# Patient Record
Sex: Female | Born: 1995 | Race: White | Hispanic: No | Marital: Single | Smoking: Never smoker
Health system: Southern US, Community
[De-identification: ages and names within clinical notes are randomized; demographics above are authoritative.]

## PROBLEM LIST (undated history)

## (undated) DIAGNOSIS — L709 Acne, unspecified: Secondary | ICD-10-CM

## (undated) HISTORY — DX: Acne, unspecified: L70.9

---
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
## 2017-07-10 DIAGNOSIS — M25562 Pain in left knee: Secondary | ICD-10-CM | POA: Diagnosis not present

## 2017-12-20 DIAGNOSIS — M940 Chondrocostal junction syndrome [Tietze]: Secondary | ICD-10-CM | POA: Diagnosis not present

## 2017-12-20 DIAGNOSIS — W0110XA Fall on same level from slipping, tripping and stumbling with subsequent striking against unspecified object, initial encounter: Secondary | ICD-10-CM | POA: Diagnosis not present

## 2018-11-06 DIAGNOSIS — F411 Generalized anxiety disorder: Secondary | ICD-10-CM | POA: Diagnosis not present

## 2019-02-18 DIAGNOSIS — M7652 Patellar tendinitis, left knee: Secondary | ICD-10-CM | POA: Diagnosis not present

## 2019-02-18 DIAGNOSIS — M25561 Pain in right knee: Secondary | ICD-10-CM | POA: Diagnosis not present

## 2019-02-18 DIAGNOSIS — M25562 Pain in left knee: Secondary | ICD-10-CM | POA: Diagnosis not present

## 2019-02-18 DIAGNOSIS — M7651 Patellar tendinitis, right knee: Secondary | ICD-10-CM | POA: Diagnosis not present

## 2020-02-10 DIAGNOSIS — Z20822 Contact with and (suspected) exposure to covid-19: Secondary | ICD-10-CM | POA: Diagnosis not present

## 2020-02-10 DIAGNOSIS — J029 Acute pharyngitis, unspecified: Secondary | ICD-10-CM | POA: Diagnosis not present

## 2020-07-02 ENCOUNTER — Ambulatory Visit (HOSPITAL_COMMUNITY)
Admission: EM | Admit: 2020-07-02 | Discharge: 2020-07-02 | Disposition: A | Discharge status: Home / Self Care | Payer: 59 | Attending: Student | Admitting: Student

## 2020-07-02 ENCOUNTER — Encounter (HOSPITAL_COMMUNITY): Payer: Self-pay

## 2020-07-02 ENCOUNTER — Other Ambulatory Visit: Payer: Self-pay

## 2020-07-02 DIAGNOSIS — J069 Acute upper respiratory infection, unspecified: Secondary | ICD-10-CM | POA: Diagnosis not present

## 2020-07-02 DIAGNOSIS — U071 COVID-19: Secondary | ICD-10-CM | POA: Insufficient documentation

## 2020-07-02 DIAGNOSIS — Z79899 Other long term (current) drug therapy: Secondary | ICD-10-CM | POA: Insufficient documentation

## 2020-07-02 DIAGNOSIS — Z283 Underimmunization status: Secondary | ICD-10-CM | POA: Diagnosis not present

## 2020-07-02 DIAGNOSIS — Z20822 Contact with and (suspected) exposure to covid-19: Secondary | ICD-10-CM | POA: Diagnosis present

## 2020-07-02 LAB — RESP PANEL BY RT-PCR (FLU A&B, COVID) ARPGX2
Influenza A by PCR: NEGATIVE
Influenza B by PCR: NEGATIVE
SARS Coronavirus 2 by RT PCR: POSITIVE — AB

## 2020-07-02 NOTE — ED Provider Notes (Signed)
[facility]    CSN: [id number] Arrival date & time: 07/02/20  1227      History   Chief Complaint Chief Complaint  Patient presents with  . Nasal Congestion  . Sore Throat    HPI Shirley Hines is a 24 y.o. female presenting with covid symptoms following covid exposure. Her mom was diagnosed with covid and pt has been in contact with her. Presenting with sore throat, congestion. Denies fevers/chills, n/v/d, shortness of breath, chest pain, cough,, facial pain, teeth pain, headaches,loss of taste/smell, swollen lymph nodes, ear pain. She is not vaccinated against covid-19.  HPI  Past Medical History:  Diagnosis Date  . Acne     Patient Active Problem List   Diagnosis Date Noted  . Pain in joint, lower leg 12/16/2013    History reviewed. No pertinent surgical history.  OB History   No obstetric history on file.      Home Medications    Prior to Admission medications   Medication Sig Start Date End Date Taking? Authorizing Provider  azithromycin (ZITHROMAX) 250 MG tablet 2 two tabs now, then 1 tablet daily for 4 days. 11/11/15   Peyton Najjar, MD  sertraline (ZOLOFT) 50 MG tablet Take 50 mg by mouth daily.    [provider]    Family History History reviewed. No pertinent family history.  Social History Social History   Tobacco Use  . Smoking status: Never Smoker  . Smokeless tobacco: Never Used  Substance Use Topics  . Alcohol use: Yes  . Drug use: Never     Allergies   Patient has no known allergies.   Review of Systems Review of Systems  HENT: Positive for congestion.   Respiratory: Positive for cough.   All other systems reviewed and are negative.    Physical Exam Triage Vital Signs ED Triage Vitals  Enc Vitals Group     BP 07/02/20 1323 101/64     Pulse Rate 07/02/20 1323 76     Resp 07/02/20 1323 18     Temp 07/02/20 1323 98.2 F (36.8 C)     Temp Source 07/02/20 1323 Oral     SpO2 07/02/20 1323 100 %      Weight --      Height --      Head Circumference --      Peak Flow --      Pain Score 07/02/20 1321 2     Pain Loc --      Pain Edu? --      Excl. in GC? --    No data found.  Updated Vital Signs BP 101/64 (BP Location: Right Arm)   Pulse 76   Temp 98.2 F (36.8 C) (Oral)   Resp 18   LMP 06/15/2020 (Approximate)   SpO2 100%   Visual Acuity Right Eye Distance:   Left Eye Distance:   Bilateral Distance:    Right Eye Near:   Left Eye Near:    Bilateral Near:     Physical Exam Vitals reviewed.  Constitutional:      General: She is not in acute distress.    Appearance: Normal appearance. She is not ill-appearing.  HENT:     Head: Normocephalic and atraumatic.     Right Ear: Hearing, tympanic membrane, ear canal and external ear normal. No swelling or tenderness. There is no impacted cerumen. No mastoid tenderness. Tympanic membrane is not perforated, erythematous, retracted or bulging.     Left Ear: Hearing,  tympanic membrane, ear canal and external ear normal. No swelling or tenderness. There is no impacted cerumen. No mastoid tenderness. Tympanic membrane is not perforated, erythematous, retracted or bulging.     Nose:     Right Sinus: No maxillary sinus tenderness or frontal sinus tenderness.     Left Sinus: No maxillary sinus tenderness or frontal sinus tenderness.     Mouth/Throat:     Mouth: Mucous membranes are moist.     Pharynx: Uvula midline. No oropharyngeal exudate or posterior oropharyngeal erythema.     Tonsils: No tonsillar exudate.  Cardiovascular:     Rate and Rhythm: Normal rate and regular rhythm.     Heart sounds: Normal heart sounds.  Pulmonary:     Breath sounds: Normal breath sounds and air entry. No decreased breath sounds, wheezing, rhonchi or rales.  Lymphadenopathy:     Cervical: No cervical adenopathy.  Neurological:     General: No focal deficit present.     Mental Status: She is alert and oriented to person, place, and time.   Psychiatric:        Attention and Perception: Attention and perception normal.        Mood and Affect: Mood and affect normal.        Behavior: Behavior is cooperative.      UC Treatments / Results  Labs (all labs ordered are listed, but only abnormal results are displayed) Labs Reviewed  RESP PANEL BY RT-PCR (FLU A&B, COVID) [id number]    EKG   Radiology No results found.  Procedures Procedures (including critical care time)  Medications Ordered in UC Medications - No data to display  Initial Impression / Assessment and Plan / UC Course  I have reviewed the triage vital signs and the nursing notes.  Pertinent labs & imaging results that were available during my care of the patient were reviewed by me and considered in my medical decision making (see chart for details).     Covid and influenza tests sent today. Patient is not vaccinated for covid-19. Isolation precautions per CDC guidelines until negative result. Symptomatic relief with OTC Mucinex, Nyquil, etc. Return precautions- new/worsening fevers/chills, shortness of breath, chest pain, abd pain, etc.   Final Clinical Impressions(s) / UC Diagnoses   Final diagnoses:  Acute upper respiratory infection     Discharge Instructions     We'll call you if the result of your covid or influenza test is positive. Use over the counter medications for symptomatic relief, like Mucinex, Nyquil, Tylenol/ibuprofen. Make sure to get plenty of rest and drink plenty of fluids.    ED Prescriptions    None     PDMP not reviewed this encounter.   Rhys Martini, PA-C 07/02/20 1353

## 2020-07-02 NOTE — Discharge Instructions (Addendum)
We'll call you if the result of your covid or influenza test is positive. Use over the counter medications for symptomatic relief, like Mucinex, Nyquil, Tylenol/ibuprofen. Make sure to get plenty of rest and drink plenty of fluids.

## 2020-07-02 NOTE — ED Triage Notes (Signed)
Pt in with c/o ST, runny nose and congestion that started today. States she was exposed to someone who tested positive for covid today  Denies nausea, vomiting, diarrhea

## 2020-12-03 LAB — CYTOLOGY - PAP: Pap: NEGATIVE

## 2021-12-17 NOTE — Progress Notes (Signed)
 This patient's chart has been reviewed by a [facility] Specialist.     Attempted to contact patient in order to discuss appointments and screenings due for the upcoming year.     UTR with recommendations and contact information.

## 2023-08-24 LAB — OB RESULTS CONSOLE PLATELET COUNT: Platelets: 239

## 2023-08-24 LAB — OB RESULTS CONSOLE HGB/HCT, BLOOD
HCT: 36 (ref 29–41)
Hemoglobin: 12.5

## 2023-08-24 LAB — CYTOLOGY - PAP

## 2023-12-18 LAB — OB RESULTS CONSOLE HGB/HCT, BLOOD
HCT: 36 (ref 29–41)
Hemoglobin: 12.5

## 2023-12-18 LAB — OB RESULTS CONSOLE PLATELET COUNT: Platelets: 239

## 2023-12-18 LAB — OB RESULTS CONSOLE ABO/RH: "RH Type ": POSITIVE

## 2023-12-18 LAB — OB RESULTS CONSOLE ANTIBODY SCREEN: Antibody Screen: NEGATIVE

## 2024-01-29 LAB — OB RESULTS CONSOLE HEPATITIS B SURFACE ANTIGEN: Hepatitis B Surface Ag: NEGATIVE

## 2024-01-29 LAB — HEPATITIS C ANTIBODY: HCV Ab: NEGATIVE

## 2024-01-29 LAB — OB RESULTS CONSOLE RPR: RPR: NONREACTIVE

## 2024-01-29 LAB — OB RESULTS CONSOLE GC/CHLAMYDIA
Chlamydia: NEGATIVE
Neisseria Gonorrhea: NEGATIVE

## 2024-01-29 LAB — OB RESULTS CONSOLE ANTIBODY SCREEN: Antibody Screen: NEGATIVE

## 2024-01-29 LAB — OB RESULTS CONSOLE RUBELLA ANTIBODY, IGM: Rubella: IMMUNE

## 2024-01-29 LAB — OB RESULTS CONSOLE HIV ANTIBODY (ROUTINE TESTING): HIV: NONREACTIVE

## 2024-01-29 LAB — OB RESULTS CONSOLE VARICELLA ZOSTER ANTIBODY, IGG: Varicella: IMMUNE

## 2024-01-30 LAB — OB RESULTS CONSOLE ABO/RH: "RH Type ": POSITIVE

## 2024-06-04 LAB — OB RESULTS CONSOLE HGB/HCT, BLOOD
HCT: 33 (ref 29–41)
Hemoglobin: 10.9

## 2024-06-04 LAB — OB RESULTS CONSOLE PLATELET COUNT: Platelets: 259

## 2024-06-04 LAB — OB RESULTS CONSOLE RPR: RPR: NONREACTIVE

## 2024-06-26 ENCOUNTER — Ambulatory Visit: Payer: Self-pay | Admitting: Certified Nurse Midwife

## 2024-06-26 ENCOUNTER — Encounter: Payer: Self-pay | Admitting: Certified Nurse Midwife

## 2024-06-26 ENCOUNTER — Other Ambulatory Visit: Payer: Self-pay

## 2024-06-26 VITALS — BP 94/62 | HR 67 | Wt 151.4 lb

## 2024-06-26 DIAGNOSIS — O0933 Supervision of pregnancy with insufficient antenatal care, third trimester: Secondary | ICD-10-CM

## 2024-06-26 DIAGNOSIS — Z3490 Encounter for supervision of normal pregnancy, unspecified, unspecified trimester: Secondary | ICD-10-CM | POA: Insufficient documentation

## 2024-06-26 DIAGNOSIS — O99343 Other mental disorders complicating pregnancy, third trimester: Secondary | ICD-10-CM | POA: Diagnosis not present

## 2024-06-26 DIAGNOSIS — Z3A31 31 weeks gestation of pregnancy: Secondary | ICD-10-CM

## 2024-06-26 DIAGNOSIS — F419 Anxiety disorder, unspecified: Secondary | ICD-10-CM

## 2024-06-26 NOTE — Progress Notes (Unsigned)
 History:   Shirley Hines is a 28 y.o. G1P0 at [redacted]w[redacted]d by {Ob dating:14516} being seen today for her first obstetrical visit.  Her obstetrical history is significant for {ob risk factors:10154}. Patient {does/does not:19097} intend to breast feed. Pregnancy history fully reviewed.  Patient reports {sx:14538}.      HISTORY: OB History  Gravida Para Term Preterm AB Living  1 0 0 0 0 0  SAB IAB Ectopic Multiple Live Births  0 0 0 0 0    # Outcome Date GA Lbr Len/2nd Weight Sex Type Anes PTL Lv  1 Current             Last pap smear was done *** and was {Normal/Abnormal Appearance:21344::normal}  Past Medical History:  Diagnosis Date   Acne    No past surgical history on file. No family history on file. Social History[1] Allergies[2] Medications Ordered Prior to Encounter[3]  Review of Systems Pertinent items noted in HPI and remainder of comprehensive ROS otherwise negative. Physical Exam:   Vitals:   06/26/24 1347  BP: 94/62  Pulse: 67  Weight: 151 lb 6.4 oz (68.7 kg)   Fetal Heart Rate (bpm): 138  Constitutional: Well-developed, well-nourished pregnant female in no acute distress.  HEENT: PERRLA Skin: normal color and turgor, no rash Cardiovascular: normal rate & rhythm, warm and well perfused Respiratory: normal effort, no problems with respiration noted GI: Abd soft, non-distended MS: Extremities nontender, no edema, normal ROM Neurologic: Alert and oriented x 4.  GU: no CVA tenderness Pelvic: NEFG, physiologic discharge, no blood, cervix clean. ***Pap/swabs collected ***Exam deferred  Assessment:    Pregnancy: G1P0 Patient Active Problem List   Diagnosis Date Noted   Supervision of low-risk pregnancy 06/26/2024   Anxiety 02/28/2024   Pain in joint, lower leg 12/16/2013     Plan:    1. Encounter for supervision of low-risk pregnancy, antepartum (Primary) ***  2. [redacted] weeks gestation of pregnancy ***  3. Anxiety ***  4. Initial obstetric visit  in third trimester ***   - Initial labs drawn. - Continue prenatal vitamins. - Problem list reviewed and updated. - Genetic Screening discussed, {Blank multiple:19196::First trimester screen,Quad screen,NIPS}: {requests/ordered/declines:14581}. - Ultrasound discussed; fetal anatomic survey: {requests/ordered/declines:14581}. - Anticipatory guidance about prenatal visits given including labs, ultrasounds, and testing. - Discussed usage of Babyscripts and virtual visits as additional source of managing and completing prenatal visits in midst of coronavirus and pandemic.   - Encouraged to complete MyChart Registration for her ability to review results, send requests, and have questions addressed.  - The nature of [facility] [facility]Faculty Practice with multiple MDs and Advanced Practice Providers was explained to patient; also emphasized that residents, students are part of our team. - Routine obstetric precautions reviewed. Encouraged to seek out care at office or emergency room [facility] preferred) for urgent and/or emergent concerns.  Return in about 2 weeks (around 07/10/2024) for IN-PERSON, LOB.    No future appointments.  Cornell Finder, MSN, CNM, IBCLC Certified Nurse Midwife, [facility]     [1]  Social History Tobacco Use   Smoking status: Never   Smokeless tobacco: Never  Substance Use Topics   Alcohol use: Yes   Drug use: Never  [2] No Known Allergies [3]  Current Outpatient Medications on File Prior to Visit  Medication Sig Dispense Refill   azithromycin  (ZITHROMAX ) 250 MG tablet 2 two tabs now, then 1 tablet daily for 4 days. 6 tablet 0  Prenatal Vit-Fe Fumarate-FA (PRENATAL PO)      sertraline (ZOLOFT) 50 MG tablet Take 50 mg by mouth daily.     No current facility-administered medications on file prior to visit.

## 2024-06-26 NOTE — Patient Instructions (Signed)
   Considering Waterbirth? Guide for patients at [facility]) Why consider waterbirth? Gentle birth for babies  Less pain medicine used in labor  May allow for passive descent/less pushing  May reduce perineal tears  More mobility and instinctive maternal position changes  Increased maternal relaxation   Is waterbirth safe? What are the risks of infection, drowning or other complications? Infection:  Very low risk (3.7 % for tub vs 4.8% for bed)  7 in 8000 waterbirths with documented infection  Poorly cleaned equipment most common cause  Slightly lower group B strep transmission rate  Drowning  Maternal:  Very low risk  Related to seizures or fainting  Newborn:  Very low risk. No evidence of increased risk of respiratory problems in multiple large studies  Physiological protection from breathing under water  Avoid underwater birth if there are any fetal complications  Once baby's head is out of the water, keep it out.  Birth complication  Some reports of cord trauma, but risk decreased by bringing baby to surface gradually  No evidence of increased risk of shoulder dystocia. Mothers can usually change positions faster in water than in a bed, possibly aiding the maneuvers to free the shoulder.   There are 2 things you MUST do to have a waterbirth with [facility]: Attend a waterbirth class at [facility]   3rd Wednesday of every month from 7-9 pm (virtual during COVID) [employer] at [email or web address] or [email or web address] or by calling [phone] Bring us  the certificate from the class to your prenatal appointment or send via MyChart Meet with a midwife at 36 weeks* to see if you can still plan a waterbirth and to sign the consent.   *We also recommend that you schedule as many of your prenatal visits with a midwife as possible.    Helpful information: You may want to bring a bathing suit top to the hospital  to wear during labor but this is optional.  All other supplies are provided by the hospital. Please arrive at the hospital with signs of active labor, and do not wait at home until late in labor. It takes 45 min- 1 hour for fetal monitoring, and check in to your room to take place, plus transport and filling of the waterbirth tub.    Things that would prevent you from having a waterbirth: Premature, <37wks  Previous cesarean birth  Presence of thick meconium-stained fluid  Multiple gestation (Twins, triplets, etc.)  Uncontrolled diabetes or gestational diabetes requiring medication  Hypertension diagnosed in pregnancy or preexisting hypertension (gestational hypertension, preeclampsia, or chronic hypertension) Fetal growth restriction (your baby measures less than 10th percentile on ultrasound) Heavy vaginal bleeding  Non-reassuring fetal heart rate  Active infection (MRSA, etc.). Group B Strep is NOT a contraindication for waterbirth.  If your labor has to be induced and induction method requires continuous monitoring of the baby's heart rate  Other risks/issues identified by your obstetrical provider   Please remember that birth is unpredictable. Under certain unforeseeable circumstances your provider may advise against giving birth in the tub. These decisions will be made on a case-by-case basis and with the safety of you and your baby as our highest priority.    Updated 10/13/21

## 2024-07-17 ENCOUNTER — Other Ambulatory Visit: Payer: Self-pay

## 2024-07-17 ENCOUNTER — Ambulatory Visit (INDEPENDENT_AMBULATORY_CARE_PROVIDER_SITE_OTHER): Payer: Self-pay | Admitting: Certified Nurse Midwife

## 2024-07-17 VITALS — BP 97/51 | HR 73 | Wt 149.0 lb

## 2024-07-17 DIAGNOSIS — Z3493 Encounter for supervision of normal pregnancy, unspecified, third trimester: Secondary | ICD-10-CM

## 2024-07-17 DIAGNOSIS — Z3A34 34 weeks gestation of pregnancy: Secondary | ICD-10-CM | POA: Diagnosis not present

## 2024-07-17 DIAGNOSIS — K219 Gastro-esophageal reflux disease without esophagitis: Secondary | ICD-10-CM | POA: Diagnosis not present

## 2024-07-17 LAB — GLUCOSE TOLERANCE, 1 HOUR: Glucose, 1 Hr, gestational: 74

## 2024-07-17 LAB — HEMOGLOBIN A1C: Hemoglobin-A1c: 5

## 2024-07-17 MED ORDER — OMEPRAZOLE 40 MG PO CPDR: 40.0000 mg | DELAYED_RELEASE_CAPSULE | Freq: Every day | ORAL | 2 refills | Status: DC

## 2024-07-17 NOTE — Progress Notes (Signed)
" ° °  PRENATAL VISIT NOTE  Subjective:  Shirley Hines is a 29 y.o. G1P0 at [redacted]w[redacted]d being seen today for ongoing prenatal care.  She is currently monitored for the following issues for this low-risk pregnancy and has Anxiety and Supervision of low-risk pregnancy on their problem list.  Patient reports increase in nausea, always in the am and increased pelvic pressure.  Contractions: Not present. Vag. Bleeding: None.  Movement: Present. Denies leaking of fluid.   The following portions of the patient's history were reviewed and updated as appropriate: allergies, current medications, past family history, past medical history, past social history, past surgical history and problem list.   Objective:   Vitals:   07/17/24 0924  BP: (!) 97/51  Pulse: 73  Weight: 149 lb (67.6 kg)   Fetal Status:  Fetal Heart Rate (bpm): 151 Fundal Height: 34 cm Movement: Present Presentation: Vertex  General: Alert, oriented and cooperative. Patient is in no acute distress.  Skin: Skin is warm and dry. No rash noted.   Cardiovascular: Normal heart rate noted  Respiratory: Normal respiratory effort, no problems with respiration noted  Abdomen: Soft, gravid, appropriate for gestational age.  Pain/Pressure: Absent     Pelvic: Cervical exam deferred        Extremities: Normal range of motion.  Edema: None  Mental Status: Normal mood and affect. Normal behavior. Normal judgment and thought content.      06/26/2024    2:23 PM 11/06/2015    8:21 AM  Depression screen PHQ 2/9  Decreased Interest 0 0  Down, Depressed, Hopeless 0 0  PHQ - 2 Score 0 0  Altered sleeping 0   Tired, decreased energy 1   Change in appetite 0   Feeling bad or failure about yourself  0   Trouble concentrating 0   Moving slowly or fidgety/restless 0   Suicidal thoughts 0   PHQ-9 Score 1         06/26/2024    2:23 PM  GAD 7 : Generalized Anxiety Score  Nervous, Anxious, on Edge 1  Control/stop worrying 1  Worry too much -  different things 1  Trouble relaxing 0  Restless 0  Easily annoyed or irritable 1  Afraid - awful might happen 0  Total GAD 7 Score 4    Assessment and Plan:  Pregnancy: G1P0 at [redacted]w[redacted]d 1. Encounter for supervision of low-risk pregnancy in third trimester (Primary) - Doing well, feeling regular and vigorous fetal movement   2. [redacted] weeks gestation of pregnancy - Routine OB care including anticipatory guidance re GBS at next visit  3. Gastroesophageal reflux disease without esophagitis - omeprazole  (PRILOSEC) 40 MG capsule; Take 1 capsule (40 mg total) by mouth daily.  Dispense: 60 capsule; Refill: 2   Preterm labor symptoms and general obstetric precautions including but not limited to vaginal bleeding, contractions, leaking of fluid and fetal movement were reviewed in detail with the patient. Please refer to After Visit Summary for other counseling recommendations.   Return in about 2 weeks (around 07/31/2024) for IN-PERSON, LOB/GBS.  Future Appointments  Date Time Provider Department Center  07/24/2024 10:55 AM Vannie Cornell JONELLE EDDY [facility]  07/31/2024  8:15 AM Vannie Cornell JONELLE, CNM [facility]  08/07/2024 10:55 AM Vannie Cornell JONELLE, CNM [facility]   Cornell JONELLE Vannie, CNM "

## 2024-07-18 ENCOUNTER — Encounter: Payer: Self-pay | Admitting: Certified Nurse Midwife

## 2024-07-18 DIAGNOSIS — K219 Gastro-esophageal reflux disease without esophagitis: Secondary | ICD-10-CM

## 2024-07-18 MED ORDER — OMEPRAZOLE 40 MG PO CPDR: 40.0000 mg | DELAYED_RELEASE_CAPSULE | Freq: Every day | ORAL | 2 refills | Status: DC

## 2024-07-24 ENCOUNTER — Encounter: Payer: Self-pay | Admitting: Certified Nurse Midwife

## 2024-07-31 ENCOUNTER — Other Ambulatory Visit: Payer: Self-pay

## 2024-07-31 ENCOUNTER — Other Ambulatory Visit (HOSPITAL_COMMUNITY)
Admission: RE | Admit: 2024-07-31 | Discharge: 2024-07-31 | Disposition: A | Discharge status: Home / Self Care | Source: Ambulatory Visit | Attending: Certified Nurse Midwife | Admitting: Certified Nurse Midwife

## 2024-07-31 ENCOUNTER — Ambulatory Visit (INDEPENDENT_AMBULATORY_CARE_PROVIDER_SITE_OTHER): Payer: Self-pay | Admitting: Certified Nurse Midwife

## 2024-07-31 VITALS — BP 101/66 | HR 86 | Wt 153.6 lb

## 2024-07-31 DIAGNOSIS — O219 Vomiting of pregnancy, unspecified: Secondary | ICD-10-CM

## 2024-07-31 DIAGNOSIS — Z3493 Encounter for supervision of normal pregnancy, unspecified, third trimester: Secondary | ICD-10-CM

## 2024-07-31 DIAGNOSIS — Z3A36 36 weeks gestation of pregnancy: Secondary | ICD-10-CM | POA: Diagnosis present

## 2024-07-31 DIAGNOSIS — Z113 Encounter for screening for infections with a predominantly sexual mode of transmission: Secondary | ICD-10-CM | POA: Diagnosis present

## 2024-07-31 NOTE — Progress Notes (Signed)
 "  PRENATAL VISIT NOTE  Subjective:  Shirley Hines is a 29 y.o. G1P0 at [redacted]w[redacted]d being seen today for ongoing prenatal care.  She is currently monitored for the following issues for this low-risk pregnancy and has Anxiety and Supervision of low-risk pregnancy on their problem list.  Patient reports nausea.  Contractions: Not present. Vag. Bleeding: None.  Movement: Present. Denies leaking of fluid.   The following portions of the patient's history were reviewed and updated as appropriate: allergies, current medications, past family history, past medical history, past social history, past surgical history and problem list.   Objective:   Vitals:   07/31/24 0835  BP: 101/66  Pulse: 86  Weight: 153 lb 9.6 oz (69.7 kg)   Fetal Status:  Fetal Heart Rate (bpm): 143 Fundal Height: 36 cm Movement: Present Presentation: Vertex  General: Alert, oriented and cooperative. Patient is in no acute distress.  Skin: Skin is warm and dry. No rash noted.   Cardiovascular: Normal heart rate noted  Respiratory: Normal respiratory effort, no problems with respiration noted  Abdomen: Soft, gravid, appropriate for gestational age.  Pain/Pressure: Absent     Pelvic: Cervical exam deferred        Extremities: Normal range of motion.  Edema: None  Mental Status: Normal mood and affect. Normal behavior. Normal judgment and thought content.      06/26/2024    2:23 PM 11/06/2015    8:21 AM  Depression screen PHQ 2/9  Decreased Interest 0 0  Down, Depressed, Hopeless 0 0  PHQ - 2 Score 0 0  Altered sleeping 0   Tired, decreased energy 1   Change in appetite 0   Feeling bad or failure about yourself  0   Trouble concentrating 0   Moving slowly or fidgety/restless 0   Suicidal thoughts 0   PHQ-9 Score 1        06/26/2024    2:23 PM  GAD 7 : Generalized Anxiety Score  Nervous, Anxious, on Edge 1   Control/stop worrying 1   Worry too much - different things 1   Trouble relaxing 0   Restless 0    Easily annoyed or irritable 1   Afraid - awful might happen 0   Total GAD 7 Score 4     Data saved with a previous flowsheet row definition   Assessment and Plan:  Pregnancy: G1P0 at [redacted]w[redacted]d 1. Encounter for supervision of low-risk pregnancy in third trimester (Primary) - Doing well, feeling regular and vigorous fetal movement  - Wanting to have a waterbirth, took class on 1/8, will upload or bring certificate  2. [redacted] weeks gestation of pregnancy - Routine OB care  - Culture, beta strep (group b only) - Cervicovaginal ancillary only( [place])  3. Nausea and vomiting during pregnancy - Omeprazole  not helping (took for 5 days), advised to try [employer] and RRL  Term labor symptoms and general obstetric precautions including but not limited to vaginal bleeding, contractions, leaking of fluid and fetal movement were reviewed in detail with the patient. Please refer to After Visit Summary for other counseling recommendations.   Return in about 1 week (around 08/07/2024) for IN-PERSON, LOB.  Future Appointments  Date Time Provider Department Center  08/07/2024 10:55 AM Vannie Cornell JONELLE EDDY [facility]  08/14/2024  9:15 AM Vannie Cornell JONELLE, CNM [facility]  08/21/2024  9:15 AM Vannie Cornell JONELLE, CNM [facility]  08/28/2024  8:15 AM Vannie Cornell JONELLE, CNM [facility]   Cornell  JONELLE Finder, CNM "

## 2024-08-01 LAB — CERVICOVAGINAL ANCILLARY ONLY
Chlamydia: NEGATIVE
Comment: NEGATIVE
Comment: NORMAL
Neisseria Gonorrhea: NEGATIVE

## 2024-08-06 LAB — CULTURE, BETA STREP (GROUP B ONLY): Strep Gp B Culture: NEGATIVE

## 2024-08-07 ENCOUNTER — Encounter: Payer: Self-pay | Admitting: Certified Nurse Midwife

## 2024-08-07 ENCOUNTER — Ambulatory Visit: Admitting: Certified Nurse Midwife

## 2024-08-07 ENCOUNTER — Other Ambulatory Visit: Payer: Self-pay

## 2024-08-07 VITALS — BP 103/66 | HR 63 | Wt 155.9 lb

## 2024-08-07 DIAGNOSIS — Z3493 Encounter for supervision of normal pregnancy, unspecified, third trimester: Secondary | ICD-10-CM

## 2024-08-07 DIAGNOSIS — Z3A37 37 weeks gestation of pregnancy: Secondary | ICD-10-CM

## 2024-08-07 NOTE — Progress Notes (Signed)
" ° °  PRENATAL VISIT NOTE  Subjective:  Shirley Hines is a 29 y.o. G1P0 at [redacted]w[redacted]d being seen today for ongoing prenatal care.  She is currently monitored for the following issues for this low-risk pregnancy and has Anxiety and Supervision of low-risk pregnancy on their problem list.  Patient reports no complaints.   . Vag. Bleeding: None.  Movement: Present. Denies leaking of fluid.   The following portions of the patient's history were reviewed and updated as appropriate: allergies, current medications, past family history, past medical history, past social history, past surgical history and problem list.   Objective:   Vitals:   08/07/24 1613  BP: 103/66  Pulse: 63  Weight: 155 lb 14.4 oz (70.7 kg)    Fetal Status:  Fetal Heart Rate (bpm): 168 Fundal Height: 37 cm Movement: Present Presentation: Vertex  General: Alert, oriented and cooperative. Patient is in no acute distress.  Skin: Skin is warm and dry. No rash noted.   Cardiovascular: Normal heart rate noted  Respiratory: Normal respiratory effort, no problems with respiration noted  Abdomen: Soft, gravid, appropriate for gestational age.  Pain/Pressure: Absent     Pelvic: Cervical exam deferred        Extremities: Normal range of motion.     Mental Status: Normal mood and affect. Normal behavior. Normal judgment and thought content.      06/26/2024    2:23 PM 11/06/2015    8:21 AM  Depression screen PHQ 2/9  Decreased Interest 0 0  Down, Depressed, Hopeless 0 0  PHQ - 2 Score 0 0  Altered sleeping 0   Tired, decreased energy 1   Change in appetite 0   Feeling bad or failure about yourself  0   Trouble concentrating 0   Moving slowly or fidgety/restless 0   Suicidal thoughts 0   PHQ-9 Score 1         06/26/2024    2:23 PM  GAD 7 : Generalized Anxiety Score  Nervous, Anxious, on Edge 1   Control/stop worrying 1   Worry too much - different things 1   Trouble relaxing 0   Restless 0   Easily annoyed or irritable  1   Afraid - awful might happen 0   Total GAD 7 Score 4     Data saved with a previous flowsheet row definition   Assessment and Plan:  Pregnancy: G1P0 at [redacted]w[redacted]d 1. Encounter for supervision of low-risk pregnancy in third trimester (Primary) - Doing well, feeling regular and vigorous fetal movement   2. [redacted] weeks gestation of pregnancy - Routine OB care  - Has her waterbirth certificate in her hospital bag  Term labor symptoms and general obstetric precautions including but not limited to vaginal bleeding, contractions, leaking of fluid and fetal movement were reviewed in detail with the patient. Please refer to After Visit Summary for other counseling recommendations.   Return in about 1 week (around 08/14/2024) for IN-PERSON, LOB.  Future Appointments  Date Time Provider Department Center  08/14/2024  9:15 AM Vannie Cornell JONELLE EDDY [facility]  08/21/2024  9:15 AM Vannie Cornell JONELLE, CNM [facility]  08/28/2024  8:15 AM Vannie Cornell JONELLE, CNM [facility]    Cornell JONELLE Vannie, CNM  "

## 2024-08-14 ENCOUNTER — Ambulatory Visit: Payer: Self-pay | Admitting: Certified Nurse Midwife

## 2024-08-14 ENCOUNTER — Other Ambulatory Visit: Payer: Self-pay

## 2024-08-14 VITALS — BP 105/70 | HR 91 | Wt 155.0 lb

## 2024-08-14 DIAGNOSIS — Z3A38 38 weeks gestation of pregnancy: Secondary | ICD-10-CM

## 2024-08-14 DIAGNOSIS — Z3493 Encounter for supervision of normal pregnancy, unspecified, third trimester: Secondary | ICD-10-CM

## 2024-08-14 NOTE — Progress Notes (Unsigned)
" ° °  PRENATAL VISIT NOTE  Subjective:  Shirley Hines is a 29 y.o. G1P0 at [redacted]w[redacted]d being seen today for ongoing prenatal care.  She is currently monitored for the following issues for this low-risk pregnancy and has Anxiety and Supervision of low-risk pregnancy on their problem list.  Patient reports no complaints and occasional lightening crotch.   . Vag. Bleeding: None.  Movement: Present. Denies leaking of fluid.   The following portions of the patient's history were reviewed and updated as appropriate: allergies, current medications, past family history, past medical history, past social history, past surgical history and problem list.   Objective:   Vitals:   08/14/24 0940  BP: 105/70  Pulse: 91  Weight: 155 lb (70.3 kg)   Fetal Status:  Fetal Heart Rate (bpm): 138 Fundal Height: 38 cm Movement: Present Presentation: Vertex  General: Alert, oriented and cooperative. Patient is in no acute distress.  Skin: Skin is warm and dry. No rash noted.   Cardiovascular: Normal heart rate noted  Respiratory: Normal respiratory effort, no problems with respiration noted  Abdomen: Soft, gravid, appropriate for gestational age.  Pain/Pressure: Present     Pelvic: Cervical exam deferred        Extremities: Normal range of motion.  Edema: None  Mental Status: Normal mood and affect. Normal behavior. Normal judgment and thought content.      06/26/2024    2:23 PM 11/06/2015    8:21 AM  Depression screen PHQ 2/9  Decreased Interest 0 0  Down, Depressed, Hopeless 0 0  PHQ - 2 Score 0 0  Altered sleeping 0   Tired, decreased energy 1   Change in appetite 0   Feeling bad or failure about yourself  0   Trouble concentrating 0   Moving slowly or fidgety/restless 0   Suicidal thoughts 0   PHQ-9 Score 1        06/26/2024    2:23 PM  GAD 7 : Generalized Anxiety Score  Nervous, Anxious, on Edge 1   Control/stop worrying 1   Worry too much - different things 1   Trouble relaxing 0   Restless  0   Easily annoyed or irritable 1   Afraid - awful might happen 0   Total GAD 7 Score 4     Data saved with a previous flowsheet row definition   Assessment and Plan:  Pregnancy: G1P0 at [redacted]w[redacted]d 1. Encounter for supervision of low-risk pregnancy in third trimester (Primary) - Doing well, feeling regular and vigorous fetal movement   2. [redacted] weeks gestation of pregnancy - Routine OB care - Reviewed end of pregnancy management such as offering membrane sweep after 40wks and timing of IOL if needed  Term labor symptoms and general obstetric precautions including but not limited to vaginal bleeding, contractions, leaking of fluid and fetal movement were reviewed in detail with the patient. Please refer to After Visit Summary for other counseling recommendations.   Return in about 1 week (around 08/21/2024) for IN-PERSON, LOB.  Future Appointments  Date Time Provider Department Center  08/21/2024  9:15 AM Vannie Cornell JONELLE EDDY [facility]  08/28/2024  8:15 AM Vannie Cornell JONELLE, CNM [facility]    Cornell JONELLE Vannie, CNM  "

## 2024-08-21 ENCOUNTER — Encounter: Payer: Self-pay | Admitting: Certified Nurse Midwife

## 2024-08-28 ENCOUNTER — Encounter: Payer: Self-pay | Admitting: Certified Nurse Midwife
